# Patient Record
Sex: Female | Born: 2000 | Race: White | Hispanic: No | Marital: Single | Smoking: Never smoker
Health system: Southern US, Community
[De-identification: ages and names within clinical notes are randomized; demographics above are authoritative.]

## PROBLEM LIST (undated history)

## (undated) DIAGNOSIS — K37 Unspecified appendicitis: Secondary | ICD-10-CM

## (undated) DIAGNOSIS — R32 Unspecified urinary incontinence: Secondary | ICD-10-CM

## (undated) HISTORY — DX: Unspecified appendicitis: K37

## (undated) HISTORY — DX: Unspecified urinary incontinence: R32

---
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
## 2021-05-11 ENCOUNTER — Other Ambulatory Visit: Payer: Self-pay

## 2021-05-11 ENCOUNTER — Encounter: Payer: Self-pay | Admitting: Emergency Medicine

## 2021-05-11 ENCOUNTER — Ambulatory Visit
Admission: EM | Admit: 2021-05-11 | Discharge: 2021-05-11 | Disposition: A | Discharge status: Home / Self Care | Payer: BC Managed Care – PPO | Attending: Emergency Medicine | Admitting: Emergency Medicine

## 2021-05-11 DIAGNOSIS — B349 Viral infection, unspecified: Secondary | ICD-10-CM

## 2021-05-11 NOTE — Discharge Instructions (Addendum)
Your COVID and Flu tests are pending.  You should self quarantine until the test results are back.    Take Tylenol or ibuprofen as needed for fever or discomfort.  Rest and keep yourself hydrated.    Follow-up with your primary care provider if your symptoms are not improving.     

## 2021-05-11 NOTE — ED Provider Notes (Signed)
Renaldo Fiddler    CSN: [id number] Arrival date & time: 05/11/21  1057      History   Chief Complaint Chief Complaint  Patient presents with   Sore Throat   Nasal Congestion    HPI Wendy Banks is a 20 y.o. female.  Patient presents with congestion, runny nose, sore throat, cough x4 days.  She thinks she may have had a low-grade fever on the first day of her symptoms but none since.  Treatment at home with cold and flu medication.  She denies rash, shortness of breath, vomiting, diarrhea, or other symptoms.  Her medical history includes tonsillectomy.  The history is provided by the patient and medical records.   Past Medical History:  Diagnosis Date   Appendicitis    Urinary incontinence     Patient Active Problem List   Diagnosis Date Noted   Incisional hernia without obstruction or gangrene 04/29/2016   Urinary incontinence in female 07/02/2015    Past Surgical History:  Procedure Laterality Date   LAPAROSCOPIC APPENDECTOMY N/A 05/09/2015   Procedure: APPENDECTOMY LAPAROSCOPIC;  Surgeon: Lattie Haw, MD;  Location: [facility] ORS;  Service: General;  Laterality: N/A;   NOSE SURGERY  2015   Dr. Andee Poles   TONSILLECTOMY  2008   Dr. Elease Etienne    OB History     Gravida  0   Para  0   Term  0   Preterm  0   AB  0   Living  0      SAB  0   IAB  0   Ectopic  0   Multiple  0   Live Births               Home Medications    Prior to Admission medications   Medication Sig Start Date End Date Taking? Authorizing Provider  Methen-Hyosc-Meth Blue-Na Phos (UROGESIC-BLUE) 81.6 MG TABS Take 1 tablet (81.6 mg total) by mouth 4 (four) times daily as needed. Patient not taking: Reported on 09/04/2019 01/09/19   Shambley, Melody N, CNM  nitrofurantoin, macrocrystal-monohydrate, (MACROBID) 100 MG capsule Take 100 mg by mouth 2 (two) times daily.    [provider]  nitrofurantoin, macrocrystal-monohydrate, (MACROBID) 100 MG capsule Take 1  capsule (100 mg total) by mouth 2 (two) times daily. Patient not taking: Reported on 09/04/2019 01/09/19   Purcell Nails, CNM    Family History Family History  Problem Relation Age of Onset   Heart disease Maternal Grandfather    Diabetes Maternal Grandfather    Diabetes Paternal Grandfather     Social History Social History   Tobacco Use   Smoking status: Never   Smokeless tobacco: Never  Vaping Use   Vaping Use: Never used  Substance Use Topics   Alcohol use: No   Drug use: No     Allergies   Cefdinir   Review of Systems Review of Systems  Constitutional:  Negative for chills and fever.  HENT:  Positive for congestion, rhinorrhea and sore throat. Negative for ear pain.   Respiratory:  Positive for cough. Negative for shortness of breath.   Gastrointestinal:  Negative for diarrhea and vomiting.  Skin:  Negative for color change and rash.  All other systems reviewed and are negative.   Physical Exam Triage Vital Signs ED Triage Vitals  Enc Vitals Group     BP      Pulse      Resp      Temp  Temp src      SpO2      Weight      Height      Head Circumference      Peak Flow      Pain Score      Pain Loc      Pain Edu?      Excl. in [place]?    No data found.  Updated Vital Signs BP 134/72 (BP Location: Left Arm)   Pulse 80   Temp 98.1 F (36.7 C) (Oral)   Resp 18   LMP 04/02/2021   SpO2 98%   Visual Acuity Right Eye Distance:   Left Eye Distance:   Bilateral Distance:    Right Eye Near:   Left Eye Near:    Bilateral Near:     Physical Exam Vitals and nursing note reviewed.  Constitutional:      General: She is not in acute distress.    Appearance: She is well-developed.  HENT:     Right Ear: Tympanic membrane normal.     Left Ear: Tympanic membrane normal.     Nose: Congestion and rhinorrhea present.     Mouth/Throat:     Mouth: Mucous membranes are moist.     Pharynx: Oropharynx is clear.  Cardiovascular:     Rate and Rhythm:  Normal rate and regular rhythm.     Heart sounds: Normal heart sounds.  Pulmonary:     Effort: Pulmonary effort is normal. No respiratory distress.     Breath sounds: Normal breath sounds.  Musculoskeletal:     Cervical back: Neck supple.  Skin:    General: Skin is warm and dry.  Neurological:     Mental Status: She is alert.  Psychiatric:        Mood and Affect: Mood normal.        Behavior: Behavior normal.     UC Treatments / Results  Labs (all labs ordered are listed, but only abnormal results are displayed) Labs Reviewed  COVID-19, FLU A+B NAA    EKG   Radiology No results found.  Procedures Procedures (including critical care time)  Medications Ordered in UC Medications - No data to display  Initial Impression / Assessment and Plan / UC Course  I have reviewed the triage vital signs and the nursing notes.  Pertinent labs & imaging results that were available during my care of the patient were reviewed by me and considered in my medical decision making (see chart for details).   Viral illness.  COVID and Flu pending.  Instructed patient to self quarantine per CDC guidelines.  Discussed symptomatic treatment including Tylenol or ibuprofen, rest, hydration.  Instructed patient to follow up with PCP if symptoms are not improving.  Patient agrees to plan of care.    Final Clinical Impressions(s) / UC Diagnoses   Final diagnoses:  Viral illness     Discharge Instructions      Your COVID and Flu tests are pending.  You should self quarantine until the test results are back.    Take Tylenol or ibuprofen as needed for fever or discomfort.  Rest and keep yourself hydrated.    Follow-up with your primary care provider if your symptoms are not improving.         ED Prescriptions   None    PDMP not reviewed this encounter.   Sharion Balloon, NP 05/11/21 [phone]

## 2021-05-11 NOTE — ED Triage Notes (Signed)
Pt c/o ST and runny nose x 4 days

## 2021-05-13 LAB — COVID-19, FLU A+B NAA
Influenza A, NAA: NOT DETECTED
Influenza B, NAA: NOT DETECTED
SARS-CoV-2, NAA: DETECTED — AB

## 2021-12-14 ENCOUNTER — Encounter: Payer: BC Managed Care – PPO | Admitting: Obstetrics

## 2022-01-11 ENCOUNTER — Encounter: Payer: Self-pay | Admitting: Obstetrics

## 2022-01-11 ENCOUNTER — Other Ambulatory Visit (HOSPITAL_COMMUNITY)
Admission: RE | Admit: 2022-01-11 | Discharge: 2022-01-11 | Disposition: A | Discharge status: Home / Self Care | Payer: BC Managed Care – PPO | Source: Ambulatory Visit | Attending: Obstetrics | Admitting: Obstetrics

## 2022-01-11 ENCOUNTER — Ambulatory Visit: Payer: BC Managed Care – PPO | Admitting: Obstetrics

## 2022-01-11 VITALS — BP 128/82 | HR 69 | Ht 67.0 in | Wt 141.8 lb

## 2022-01-11 DIAGNOSIS — N760 Acute vaginitis: Secondary | ICD-10-CM | POA: Diagnosis not present

## 2022-01-11 DIAGNOSIS — N920 Excessive and frequent menstruation with regular cycle: Secondary | ICD-10-CM

## 2022-01-11 NOTE — Progress Notes (Signed)
GYN ENCOUNTER  Subjective  HPI: Wendy Banks is a 21 y.o. G0P0000 who presents today for evaluation of heavy menstrual bleeding. She reports that she started OCPs, which has improved her cramping. However, her bleeding has not improved, and she passed a large, irregular, semi-solid and gelatinous clot that has the appearance of endometrial tissue or products of conception. Wendy Banks states strongly that she has never been sexually active and denies possible pregnancy or STI.  Past Medical History:  Diagnosis Date   Appendicitis    Urinary incontinence    Past Surgical History:  Procedure Laterality Date   LAPAROSCOPIC APPENDECTOMY N/A 05/09/2015   Procedure: APPENDECTOMY LAPAROSCOPIC;  Surgeon: Lattie Haw, MD;  Location: [facility] ORS;  Service: General;  Laterality: N/A;   NOSE SURGERY  2015   Dr. Andee Poles   TONSILLECTOMY  2008   Dr. Elease Etienne   OB History     Gravida  0   Para  0   Term  0   Preterm  0   AB  0   Living  0      SAB  0   IAB  0   Ectopic  0   Multiple  0   Live Births             Allergies  Allergen Reactions   Cefdinir Itching and Rash     Negative except as noted in HPI History obtained from the patient  Objective  BP 128/82   Pulse 69   Ht 5\' 7"  (1.702 m)   Wt 141 lb 12.8 oz (64.3 kg)   LMP 12/29/2021 (Exact Date)   BMI 22.21 kg/m    General appearance: alert, cooperative, pale  Assessment 1) Abnormal menstrual bleeding with passage of atypical clot  Plan 1) Continue OCPs. Pelvic 02/28/2022 ordered. Swab collected. Declines STI testing. Refer to MD for further evaluation. 2) Recommend iron supplementation due to significant chronic blood loss  [place], CNM

## 2022-01-13 LAB — CERVICOVAGINAL ANCILLARY ONLY
Bacterial Vaginitis (gardnerella): POSITIVE — AB
Candida Glabrata: NEGATIVE
Candida Vaginitis: NEGATIVE
Chlamydia: NEGATIVE
Comment: NEGATIVE
Comment: NEGATIVE
Comment: NEGATIVE
Comment: NEGATIVE
Comment: NEGATIVE
Comment: NORMAL
Neisseria Gonorrhea: NEGATIVE
Trichomonas: NEGATIVE

## 2022-01-14 ENCOUNTER — Other Ambulatory Visit: Payer: Self-pay | Admitting: Obstetrics

## 2022-01-14 ENCOUNTER — Encounter: Payer: Self-pay | Admitting: Obstetrics

## 2022-01-14 MED ORDER — METRONIDAZOLE 500 MG PO TABS: 500.0000 mg | ORAL_TABLET | Freq: Two times a day (BID) | ORAL | 0 refills | Status: DC

## 2022-01-18 ENCOUNTER — Ambulatory Visit (INDEPENDENT_AMBULATORY_CARE_PROVIDER_SITE_OTHER): Payer: BC Managed Care – PPO

## 2022-01-18 DIAGNOSIS — N920 Excessive and frequent menstruation with regular cycle: Secondary | ICD-10-CM

## 2022-01-28 ENCOUNTER — Encounter: Payer: Self-pay | Admitting: Obstetrics and Gynecology

## 2022-01-28 ENCOUNTER — Ambulatory Visit: Payer: BC Managed Care – PPO | Admitting: Obstetrics and Gynecology

## 2022-01-28 VITALS — BP 124/80 | HR 91 | Resp 16 | Ht 67.0 in | Wt 138.9 lb

## 2022-01-28 DIAGNOSIS — N920 Excessive and frequent menstruation with regular cycle: Secondary | ICD-10-CM

## 2022-01-28 DIAGNOSIS — N946 Dysmenorrhea, unspecified: Secondary | ICD-10-CM | POA: Diagnosis not present

## 2022-01-28 MED ORDER — SYEDA 3-0.03 MG PO TABS: 1.0000 | ORAL_TABLET | Freq: Every day | ORAL | 3 refills | Status: DC

## 2022-01-28 NOTE — Progress Notes (Signed)
    GYNECOLOGY PROGRESS NOTE  Subjective:    Patient ID: Wendy Banks, female    DOB: 06-20-2000, 21 y.o.   MRN: [id number]  HPI  Patient is a 21 y.o. G5P0000 female who presents for follow up after pelvic ultrasound. Referred by Guadlupe Spanish, CNM. She had an ultrasound performed new onset menorrhagia and dysmenorrhea on 01/18/2022. Had nausea/vomiting for her last 2 cycles as well as heavy cycles with passage of clots and tissue. Cycles typically last full 7 days. Was initiated on OCPs in mid-July.      The following portions of the patient's history were reviewed and updated as appropriate: allergies, current medications, past family history, past medical history, past social history, past surgical history, and problem list.  Review of Systems Pertinent items are noted in HPI.   Objective:  Blood pressure 124/80, pulse 91, resp. rate 16, height 5\' 7"  (1.702 m), weight 138 lb 14.4 oz (63 kg), last menstrual period 12/29/2021.  General appearance: alert, cooperative, and no distress Abdomen: soft, non-tender; bowel sounds normal; no masses,  no organomegaly Remainder of exam deferred   Imaging:  02/28/2022 PELVIC COMPLETE WITH TRANSVAGINAL Patient Name: Wendy Banks DOB: 12-26-2000 MRN: [id number] LMP: 01/06/2022  ULTRASOUND REPORT [facility] Date of Service: 01/18/2022   Indications: menorrhagia, dysmenorrhea  Transabdominal only (patient is virginal)  Findings:  The uterus is anteverted and measures 7.3 x 4.0 x 5.2 cm. Echo texture is homogenous without evidence of focal masses.  The Endometrium measures 3.0 mm.  Right Ovary measures 3.1 x 2.5 x 3.2 cm. It is normal in appearance. Left Ovary measures 2.7 x 2.3 x 3.1 cm. It is normal in appearance. Survey of the adnexa demonstrates no adnexal masses. There is no free fluid in the cul de sac.  Impression: 1. Normal pelvic ultrasound  Recommendations: 1.Clinical correlation with the patient's History and  Physical Exam.  01/20/2022, RDMS, RVT  The ultrasound images and findings were reviewed by me and I agree with  the above report.  Willette Alma, M.D. 01/25/2022 10:49 AM   Assessment:   1. Menorrhagia with regular cycle   2. Dysmenorrhea      Plan:   Unclear cause of new onset menorrhagia. Ultrasound overall appears normal, no masses or other significant findings. On further discussion of patient's dysmenorrhea, she also notes nausea/vomiting, and pelvic pain that radiates down her legs each month. Discussed differential diagnosis, including endometriosis.  Recently initiated on combined OCPs. Discussed that she should continue medication for at least 3 months to better manage her cycles. Can utilize in cyclic or continuous fashion, discussed risks and benefits of each. Patient ok to use continuously.  Given new prescription for OCPs.  Can use NSAIDs, Tylenol for dysmenorrhea as needed.    01/27/2022, MD [facility]

## 2022-02-22 ENCOUNTER — Telehealth: Payer: Self-pay | Admitting: Obstetrics and Gynecology

## 2022-02-22 MED ORDER — SYEDA 3-0.03 MG PO TABS: 1.0000 | ORAL_TABLET | Freq: Every day | ORAL | 0 refills | Status: DC

## 2022-02-22 NOTE — Telephone Encounter (Signed)
Pt went to refill her script and its saying she needs to call the office she has anappt in Nov can we call her in a script until then

## 2022-02-22 NOTE — Addendum Note (Signed)
Addended by: Chilton Greathouse on: 02/22/2022 03:36 PM   Modules accepted: Orders

## 2022-04-29 ENCOUNTER — Encounter: Payer: BC Managed Care – PPO | Admitting: Obstetrics and Gynecology

## 2022-05-11 NOTE — Progress Notes (Unsigned)
    GYNECOLOGY PROGRESS NOTE  Subjective:    Patient ID: Wendy Banks, female    DOB: July 04, 2000, 21 y.o.   MRN: [id number]  HPI  Patient is a 21 y.o. G0P0000 female who presents for 3 month follow up on abnormal menses.  {Common ambulatory SmartLinks:19316}  Review of Systems {ros; complete:30496}   Objective:   There were no vitals taken for this visit. There is no height or weight on file to calculate BMI. General appearance: {general exam:16600} Abdomen: {abdominal exam:16834} Pelvic: {pelvic exam:16852::"cervix normal in appearance","external genitalia normal","no adnexal masses or tenderness","no cervical motion tenderness","rectovaginal septum normal","uterus normal size, shape, and consistency","vagina normal without discharge"} Extremities: {extremity exam:5109} Neurologic: {neuro exam:17854}   Assessment:   No diagnosis found.   Plan:   There are no diagnoses linked to this encounter.     Hildred Laser, MD [facility]

## 2022-05-12 ENCOUNTER — Ambulatory Visit: Payer: BC Managed Care – PPO | Admitting: Obstetrics and Gynecology

## 2022-05-12 ENCOUNTER — Encounter: Payer: Self-pay | Admitting: Obstetrics and Gynecology

## 2022-05-12 VITALS — BP 121/75 | HR 68 | Resp 16 | Ht 67.0 in | Wt 145.6 lb

## 2022-05-12 DIAGNOSIS — N946 Dysmenorrhea, unspecified: Secondary | ICD-10-CM | POA: Diagnosis not present

## 2022-05-12 DIAGNOSIS — N920 Excessive and frequent menstruation with regular cycle: Secondary | ICD-10-CM

## 2022-05-12 DIAGNOSIS — M5431 Sciatica, right side: Secondary | ICD-10-CM | POA: Diagnosis not present

## 2022-05-12 DIAGNOSIS — M5432 Sciatica, left side: Secondary | ICD-10-CM | POA: Diagnosis not present

## 2022-05-12 MED ORDER — SYEDA 3-0.03 MG PO TABS: 1.0000 | ORAL_TABLET | Freq: Every day | ORAL | 3 refills | Status: DC

## 2023-01-01 ENCOUNTER — Encounter: Payer: Self-pay | Admitting: Obstetrics and Gynecology

## 2023-01-03 ENCOUNTER — Other Ambulatory Visit: Payer: Self-pay

## 2023-01-03 MED ORDER — SYEDA 3-0.03 MG PO TABS: 1.0000 | ORAL_TABLET | Freq: Every day | ORAL | 3 refills | Status: DC

## 2023-01-03 NOTE — Telephone Encounter (Signed)
Yes, a 1 month prescription can be sent to the pharmacy requested.

## 2023-01-30 ENCOUNTER — Other Ambulatory Visit: Payer: Self-pay | Admitting: Obstetrics and Gynecology

## 2023-03-15 ENCOUNTER — Other Ambulatory Visit: Payer: Self-pay

## 2023-03-15 ENCOUNTER — Emergency Department: Payer: 59

## 2023-03-15 ENCOUNTER — Emergency Department
Admission: EM | Admit: 2023-03-15 | Discharge: 2023-03-15 | Disposition: A | Discharge status: Home / Self Care | Payer: 59 | Attending: Emergency Medicine | Admitting: Emergency Medicine

## 2023-03-15 DIAGNOSIS — S0990XA Unspecified injury of head, initial encounter: Secondary | ICD-10-CM | POA: Diagnosis not present

## 2023-03-15 DIAGNOSIS — W228XXA Striking against or struck by other objects, initial encounter: Secondary | ICD-10-CM | POA: Insufficient documentation

## 2023-03-15 DIAGNOSIS — S060X0A Concussion without loss of consciousness, initial encounter: Secondary | ICD-10-CM | POA: Diagnosis not present

## 2023-03-15 MED ORDER — ONDANSETRON 4 MG PO TBDP
4.0000 mg | ORAL_TABLET | Freq: Once | ORAL | Status: AC
  Administered 2023-03-15: 4 mg via ORAL
  Filled 2023-03-15: qty 1

## 2023-03-15 NOTE — ED Provider Notes (Signed)
CT Head Wo Contrast  Result Date: 03/15/2023 CLINICAL DATA:  Hit head 5 days ago, dizziness, headache EXAM: CT HEAD WITHOUT CONTRAST TECHNIQUE: Contiguous axial images were obtained from the base of the skull through the vertex without intravenous contrast. RADIATION DOSE REDUCTION: This exam was performed according to the departmental dose-optimization program which includes automated exposure control, adjustment of the mA and/or kV according to patient size and/or use of iterative reconstruction technique. COMPARISON:  None Available. FINDINGS: Brain: There is no acute intracranial hemorrhage, extra-axial fluid collection, or acute infarct. Parenchymal volume is normal. The ventricles are normal in size. Gray-white differentiation is preserved The pituitary and suprasellar region are normal. There is no mass lesion there is no mass effect or midline shift. Vascular: No hyperdense vessel or unexpected calcification. Skull: Normal. Negative for fracture or focal lesion. Sinuses/Orbits: The imaged paranasal sinuses are clear. The globes and orbits are unremarkable. Other: The mastoid air cells and middle ear cavities are clear. IMPRESSION: Normal head CT. Electronically Signed   By: Lesia Hausen M.D.   On: 03/15/2023 19:13      Sharyn Creamer, MD 03/18/23 [phone]

## 2023-03-15 NOTE — ED Notes (Signed)
See triage note   Presents with head ache  States she hit her head several days ago  No LOC but has been dizzy and slightly off balance

## 2023-03-15 NOTE — ED Notes (Signed)
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 Pt also told KC has unsteady gait. Pt noted to have steady gate by [facility] nurse.

## 2023-03-15 NOTE — ED Provider Notes (Signed)
[facility] Provider Note    Event Date/Time   First MD Initiated Contact with Patient 03/15/23 1702     (approximate)   History   Headache   HPI  Wendy Banks is a 22 y.o. 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  Did not lose consciousness but briefly felt like she "saw stars".  She has had persistent area of tenderness as well as a mild headache over the left frontal scalp where she hit her head.  She did not lose consciousness.  She also reports a very mild nausea.  No light sensitivity no neck injury no other symptoms except she feels just a little bit off balance at times  Denies pregnancy     Physical Exam   Triage Vital Signs: ED Triage Vitals  Encounter Vitals Group     BP 03/15/23 1651 119/73     Systolic BP Percentile --      Diastolic BP Percentile --      Pulse Rate 03/15/23 1651 80     Resp 03/15/23 1651 16     Temp 03/15/23 1651 98.6 F (37 C)     Temp Source 03/15/23 1651 Oral     SpO2 03/15/23 1651 96 %     Weight 03/15/23 1652 142 lb (64.4 kg)     Height 03/15/23 1652 5\' 8"  (1.727 m)     Head Circumference --      Peak Flow --      Pain Score 03/15/23 1648 7     Pain Loc --      Pain Education --      Exclude from Growth Chart --     Most recent vital signs: Vitals:   03/15/23 1651  BP: 119/73  Pulse: 80  Resp: 16  Temp: 98.6 F (37 C)  SpO2: 96%     General: Awake, no distress.  Normocephalic atraumatic, reports mild tenderness along the left frontal scalp though no hematoma or contusion is present.  I localizes that is the area where she gets a mild headache as well CV:  Good peripheral perfusion.  Resp:  Normal effort.  Abd:  No distention.  Other:  Walks with normal gait.  No ataxia noted.  Walks heel-to-toe line without difficulty.  Speech is clear moves all extremities  well with 5 out of 5 strength.  No sensory deficits.  Normal cranial nerve exam   ED Results / Procedures / Treatments   Labs (all labs ordered are listed, but only abnormal results are displayed) Labs Reviewed - No data to display   RADIOLOGY  CT imaging interpreted by me as grossly negative for acute finding or intracranial hemorrhage.   PROCEDURES:  Critical Care performed: No  Procedures   MEDICATIONS ORDERED IN ED: Medications  ondansetron (ZOFRAN-ODT) disintegrating tablet 4 mg (4 mg Oral Given 03/15/23 1714)     IMPRESSION / MDM / ASSESSMENT AND PLAN / ED COURSE  I reviewed the triage vital signs and the nursing notes.                              Differential diagnosis includes, but is not limited to, most likely very mild concussion, also wish to exclude intracranial hemorrhage though this seems highly unlikely in a emergent surgical sense given  her 5 days of symptoms, but discussed with the patient and will proceed with CT of the head without contrast.  Also suspect mild contusion left scalp.  She is awake alert well-oriented hemodynamically stable.    Patient's presentation is most consistent with acute complicated illness / injury requiring diagnostic workup.   Upon review of CT scan which is reassuring in context of patient's condition, suspect mild concussion.  Return precautions and treatment recommendations and follow-up discussed with the patient who is agreeable with the plan.        FINAL CLINICAL IMPRESSION(S) / ED DIAGNOSES   Final diagnoses:  Concussion without loss of consciousness, initial encounter     Rx / DC Orders   ED Discharge Orders     None        Note:  This document was prepared using Dragon voice recognition software and may include unintentional dictation errors.   Sharyn Creamer, MD 03/15/23 [phone]

## 2023-03-15 NOTE — ED Triage Notes (Signed)
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 Steady gait noted. Denies blurry vision. MD at [facility] recommended head CT. Speech clear.

## 2023-05-08 ENCOUNTER — Emergency Department
Admission: EM | Admit: 2023-05-08 | Discharge: 2023-05-08 | Disposition: A | Discharge status: Home / Self Care | Payer: 59 | Attending: Emergency Medicine | Admitting: Emergency Medicine

## 2023-05-08 ENCOUNTER — Emergency Department: Payer: 59

## 2023-05-08 ENCOUNTER — Other Ambulatory Visit: Payer: Self-pay

## 2023-05-08 DIAGNOSIS — R1011 Right upper quadrant pain: Secondary | ICD-10-CM | POA: Insufficient documentation

## 2023-05-08 DIAGNOSIS — R110 Nausea: Secondary | ICD-10-CM | POA: Diagnosis not present

## 2023-05-08 DIAGNOSIS — R079 Chest pain, unspecified: Secondary | ICD-10-CM | POA: Diagnosis not present

## 2023-05-08 DIAGNOSIS — R0789 Other chest pain: Secondary | ICD-10-CM | POA: Diagnosis not present

## 2023-05-08 DIAGNOSIS — R197 Diarrhea, unspecified: Secondary | ICD-10-CM | POA: Insufficient documentation

## 2023-05-08 LAB — CBC
HCT: 39.1 % (ref 36.0–46.0)
Hemoglobin: 13.3 g/dL (ref 12.0–15.0)
MCH: 29 pg (ref 26.0–34.0)
MCHC: 34 g/dL (ref 30.0–36.0)
MCV: 85.4 fL (ref 80.0–100.0)
Platelets: 305 10*3/uL (ref 150–400)
RBC: 4.58 MIL/uL (ref 3.87–5.11)
RDW: 12.2 % (ref 11.5–15.5)
WBC: 6.8 10*3/uL (ref 4.0–10.5)
nRBC: 0 % (ref 0.0–0.2)

## 2023-05-08 LAB — TROPONIN I (HIGH SENSITIVITY): Troponin I (High Sensitivity): <2 ng/L (ref <18)

## 2023-05-08 LAB — BASIC METABOLIC PANEL
Anion gap: 9 (ref 5–15)
BUN: 9 mg/dL (ref 6–20)
CO2: 23 mmol/L (ref 22–32)
Calcium: 8.8 mg/dL — ABNORMAL LOW (ref 8.9–10.3)
Chloride: 103 mmol/L (ref 98–111)
Creatinine, Ser: 0.61 mg/dL (ref 0.44–1.00)
GFR, Estimated: >60 mL/min (ref >60)
Glucose, Bld: 95 mg/dL (ref 70–99)
Potassium: 3.3 mmol/L — ABNORMAL LOW (ref 3.5–5.1)
Sodium: 135 mmol/L (ref 135–145)

## 2023-05-08 LAB — HEPATIC FUNCTION PANEL
ALT: 22 U/L (ref 0–44)
AST: 25 U/L (ref 15–41)
Albumin: 3.7 g/dL (ref 3.5–5.0)
Alkaline Phosphatase: 58 U/L (ref 38–126)
Bilirubin, Direct: <0.1 mg/dL (ref 0.0–0.2)
Total Bilirubin: 0.5 mg/dL (ref <1.2)
Total Protein: 7.3 g/dL (ref 6.5–8.1)

## 2023-05-08 LAB — LIPASE, BLOOD: Lipase: 34 U/L (ref 11–51)

## 2023-05-08 MED ORDER — ONDANSETRON 4 MG PO TBDP
4.0000 mg | ORAL_TABLET | Freq: Once | ORAL | Status: AC
  Administered 2023-05-08: 4 mg via ORAL
  Filled 2023-05-08: qty 1

## 2023-05-08 MED ORDER — ALUM & MAG HYDROXIDE-SIMETH 200-200-20 MG/5ML PO SUSP
30.0000 mL | Freq: Once | ORAL | Status: AC
  Administered 2023-05-08: 30 mL via ORAL
  Filled 2023-05-08: qty 30

## 2023-05-08 MED ORDER — DICYCLOMINE HCL 10 MG/5ML PO SOLN
10.0000 mg | Freq: Once | ORAL | Status: AC
  Administered 2023-05-08: 10 mg via ORAL
  Filled 2023-05-08 (×2): qty 5

## 2023-05-08 MED ORDER — ONDANSETRON 4 MG PO TBDP: 4.0000 mg | ORAL_TABLET | Freq: Three times a day (TID) | ORAL | 1 refills | Status: AC | PRN

## 2023-05-08 NOTE — Discharge Instructions (Addendum)
You were evaluated in the ED for abdominal pain.  Your blood work is reassuring.  Your lipase is normal.  Your troponin and hepatic panel is normal.  Your chest x-ray is normal.  Your ultrasound is normal.  Your potassium is mildly low at 3.3.  You can include potassium in your diet by eating dark leafy greens and fruits such as bananas and oranges.  If symptoms persist please follow-up with your primary care.  Ensure to monitor your symptoms.

## 2023-05-08 NOTE — ED Provider Triage Note (Signed)
Emergency Medicine Provider Triage Evaluation Note  Wendy Banks , a 22 y.o. female  was evaluated in triage.  Pt complains of RUQ pain x 2 days with nausea. Worse after eating food. Denies fever but has taking a lot of ibuprofen. Denies recent abdominal surgeries. No appendix  Review of Systems  Positive:  Negative: SOB and chest pain    Physical Exam  BP 137/79 (BP Location: Right Arm)   Pulse 80   Temp 98.7 F (37.1 C) (Oral)   Resp 15   Wt 64.4 kg   BMI 21.59 kg/m  Gen:   Awake, no distress   Resp:  Normal effort  MSK:   Moves extremities without difficulty  Other:  RUQ tenderness. (+) Murphy  Medical Decision Making  Medically screening exam initiated at 7:06 PM.  Appropriate orders placed.  Wendy Banks was informed that the remainder of the evaluation will be completed by another provider, this initial triage assessment does not replace that evaluation, and the importance of remaining in the ED until their evaluation is complete.  [place] RUQ and Liver panel ordered.    Wendy Banks A, PA-C 05/08/23 1908

## 2023-05-08 NOTE — ED Provider Notes (Signed)
[facility] Emergency Department Provider Note     Event Date/Time   First MD Initiated Contact with Patient 05/08/23 1931     (approximate)   History   No chief complaint on file.   HPI  Wendy Banks is a 22 y.o. female with no significant past medical history presents to the ED with complaint of right upper quadrant pain x 2 days with associated symptoms of nausea but no active vomiting.  She reports it is worse after eating food.  She denies fever but has taken ibuprofen for the pain.  She denies any recent abdominal surgeries.  She endorses recent illness last week of sinus congestion and cough.  Endorses 1 episode of diarrhea.     Physical Exam   Triage Vital Signs: ED Triage Vitals  Encounter Vitals Group     BP 05/08/23 1702 137/79     Systolic BP Percentile --      Diastolic BP Percentile --      Pulse Rate 05/08/23 1702 80     Resp 05/08/23 1702 15     Temp 05/08/23 1702 98.7 F (37.1 C)     Temp Source 05/08/23 1702 Oral     SpO2 --      Weight 05/08/23 1716 141 lb 15.6 oz (64.4 kg)     Height --      Head Circumference --      Peak Flow --      Pain Score 05/08/23 1716 4     Pain Loc --      Pain Education --      Exclude from Growth Chart --     Most recent vital signs: Vitals:   05/08/23 1702  BP: 137/79  Pulse: 80  Resp: 15  Temp: 98.7 F (37.1 C)   General: Well appearing. Alert and oriented. INAD.  Skin:  Warm, dry and intact. No rashes or lesions noted.     Head:  NCAT.  Eyes:  PERRLA. EOMI.  CV:  Good peripheral perfusion. RRR.  RESP:  Normal effort. LCTAB. No retractions.  ABD:  No distention. Soft, tenderness in right upper quadrant.    ED Results / Procedures / Treatments   Labs (all labs ordered are listed, but only abnormal results are displayed) Labs Reviewed  BASIC METABOLIC PANEL - Abnormal; Notable for the following components:      Result Value   Potassium 3.3 (*)    Calcium 8.8 (*)     All other components within normal limits  CBC  LIPASE, BLOOD  HEPATIC FUNCTION PANEL  POC URINE PREG, ED  TROPONIN I (HIGH SENSITIVITY)  TROPONIN I (HIGH SENSITIVITY)    RADIOLOGY  I personally viewed and evaluated these images as part of my medical decision making, as well as reviewing the written report by the radiologist.  ED Provider Interpretation: Ultrasound is unremarkable.  US Abdomen Limited RUQ (LIVER/GB)  Result Date: 05/08/2023 CLINICAL DATA:  Right upper quadrant pain. EXAM: ULTRASOUND ABDOMEN LIMITED RIGHT UPPER QUADRANT COMPARISON:  None Available. FINDINGS: Gallbladder: No gallstones or wall thickening visualized. No sonographic Murphy sign noted by sonographer. Common bile duct: Diameter: 1 mm Liver: No focal lesion identified. Within normal limits in parenchymal echogenicity. Portal vein is patent on color Doppler imaging with normal direction of blood flow towards the liver. Other: None. IMPRESSION: Normal right upper quadrant ultrasound. Electronically Signed   By: Amie Portland M.D.   On: 05/08/2023 19:42   DG Chest 2  View  Result Date: 05/08/2023 CLINICAL DATA:  Right-sided chest pain that radiates to the epigastric region. EXAM: CHEST - 2 VIEW COMPARISON:  04/12/2005 FINDINGS: Heart size and mediastinal contours are normal. No pleural fluid, interstitial edema, or airspace consolidation. The visualized osseous structures are within normal limits. IMPRESSION: No active cardiopulmonary disease. Electronically Signed   By: Signa Kell M.D.   On: 05/08/2023 17:40    PROCEDURES:  Critical Care performed: No  Procedures   MEDICATIONS ORDERED IN ED: Medications  alum & mag hydroxide-simeth (MAALOX/MYLANTA) 200-200-20 MG/5ML suspension 30 mL (has no administration in time range)  dicyclomine (BENTYL) 10 MG/5ML solution 10 mg (has no administration in time range)  ondansetron (ZOFRAN-ODT) disintegrating tablet 4 mg (has no administration in time range)      IMPRESSION / MDM / ASSESSMENT AND PLAN / ED COURSE  I reviewed the triage vital signs and the nursing notes.                               22 y.o. female presents to the emergency department for evaluation and treatment of right upper quadrant pain. See HPI for further details.   Differential diagnosis includes, but is not limited to cholelithiasis, cholecystitis, constipation, viral gastroenteritis  Patient's presentation is most consistent with acute complicated illness / injury requiring diagnostic workup.  Patient is alert and oriented.  She is hemodynamically stable and afebrile.  Exam findings are overall benign and pertinent for right upper quadrant tenderness to palpation.  Lab work is reassuring.  Lipase is normal.  Patient reports she is not having any shortness of breath or chest pain.  However, chest x-ray and troponin is normal.  Ultrasound obtained and is unremarkable.  Liver panel reveals no abnormality of liver enzymes which is reassuring.  Patient declined providing urine pregnancy test as she reports there is no way that she could be pregnant at this time as her menstrual cycle was just on during Thanksgiving.  Given patient's history of recent illness I do believe this is clinically consistent with a viral gastroenteritis.  GI cocktail provided in the ED.  Will prescribe Zofran for outpatient use.  Patient is encouraged to monitor her symptoms and follow-up with her primary care as needed.  She is in stable condition for discharge home.  ED precautions discussed.  FINAL CLINICAL IMPRESSION(S) / ED DIAGNOSES   Final diagnoses:  Right upper quadrant abdominal pain   Rx / DC Orders   ED Discharge Orders          Ordered    ondansetron (ZOFRAN-ODT) 4 MG disintegrating tablet  Every 8 hours PRN        05/08/23 2122            Note:  This document was prepared using Dragon voice recognition software and may include unintentional dictation errors.    Romeo Apple,  Charonda Hefter A, PA-C 05/08/23 2133    Phineas Semen, MD 05/08/23 2250

## 2023-05-08 NOTE — ED Notes (Signed)
Per lab, lipase is 34 but is not showing in MR. MD aware.

## 2023-05-08 NOTE — ED Triage Notes (Signed)
C/O sob and shooting pain  from xyphoid radiating to RUQ and intermittently to back.. Symptoms x 2 days.

## 2023-05-09 ENCOUNTER — Telehealth: Payer: Self-pay | Admitting: Internal Medicine

## 2023-05-09 NOTE — Telephone Encounter (Signed)
Patient's mother in law called to ask if Dr. Para March may be able to take her as a new patient? States that patient was seen in ED for abd pain and needs f/u, says she does not have a pcp.  Mother in law Gae Gallop sees Dr Para March and patient's husband does as well. Okay to make appt to establish care?

## 2023-05-10 ENCOUNTER — Other Ambulatory Visit: Payer: Self-pay | Admitting: Internal Medicine

## 2023-05-10 ENCOUNTER — Ambulatory Visit
Admission: RE | Admit: 2023-05-10 | Discharge: 2023-05-10 | Disposition: A | Discharge status: Home / Self Care | Payer: 59 | Source: Ambulatory Visit | Attending: Internal Medicine | Admitting: Internal Medicine

## 2023-05-10 DIAGNOSIS — R1031 Right lower quadrant pain: Secondary | ICD-10-CM | POA: Insufficient documentation

## 2023-05-10 DIAGNOSIS — R100 Acute abdomen: Secondary | ICD-10-CM | POA: Insufficient documentation

## 2023-05-10 DIAGNOSIS — Z3202 Encounter for pregnancy test, result negative: Secondary | ICD-10-CM | POA: Diagnosis not present

## 2023-05-10 MED ORDER — IOHEXOL 300 MG/ML  SOLN
100.0000 mL | Freq: Once | INTRAMUSCULAR | Status: AC | PRN
  Administered 2023-05-10: 100 mL via INTRAVENOUS

## 2023-05-10 NOTE — Telephone Encounter (Signed)
Please schedule patient to be seen in office. Thank you.

## 2023-05-10 NOTE — Telephone Encounter (Signed)
Spoke with patient and stated that she was able to get in with her provider in [place]. She did want to say thank you to Dr. Para March.

## 2023-05-10 NOTE — Telephone Encounter (Signed)
Please set up when possible.  Thanks.

## 2023-05-11 ENCOUNTER — Other Ambulatory Visit: Payer: Self-pay | Admitting: Internal Medicine

## 2023-05-11 DIAGNOSIS — R1031 Right lower quadrant pain: Secondary | ICD-10-CM

## 2023-05-11 DIAGNOSIS — R100 Acute abdomen: Secondary | ICD-10-CM

## 2023-05-16 DIAGNOSIS — R1031 Right lower quadrant pain: Secondary | ICD-10-CM | POA: Diagnosis not present

## 2023-06-02 ENCOUNTER — Encounter: Payer: Self-pay | Admitting: Obstetrics and Gynecology

## 2023-06-03 ENCOUNTER — Other Ambulatory Visit: Payer: Self-pay | Admitting: Obstetrics and Gynecology

## 2023-06-03 ENCOUNTER — Other Ambulatory Visit: Payer: Self-pay

## 2023-06-03 DIAGNOSIS — N920 Excessive and frequent menstruation with regular cycle: Secondary | ICD-10-CM

## 2023-06-03 MED ORDER — SYEDA 3-0.03 MG PO TABS: 1.0000 | ORAL_TABLET | Freq: Every day | ORAL | 0 refills | Status: DC

## 2023-07-01 NOTE — Patient Instructions (Signed)
Preventive Care 22-22 Years Old, Female Preventive care refers to lifestyle choices and visits with your health care provider that can promote health and wellness. At this stage in your life, you may start seeing a primary care physician instead of a pediatrician for your preventive care. Preventive care visits are also called wellness exams. What can I expect for my preventive care visit? Counseling During your preventive care visit, your health care provider may ask about your: Medical history, including: Past medical problems. Family medical history. Pregnancy history. Current health, including: Menstrual cycle. Method of birth control. Emotional well-being. Home life and relationship well-being. Sexual activity and sexual health. Lifestyle, including: Alcohol, nicotine or tobacco, and drug use. Access to firearms. Diet, exercise, and sleep habits. Sunscreen use. Motor vehicle safety. Physical exam Your health care provider may check your: Height and weight. These may be used to calculate your BMI (body mass index). BMI is a measurement that tells if you are at a healthy weight. Waist circumference. This measures the distance around your waistline. This measurement also tells if you are at a healthy weight and may help predict your risk of certain diseases, such as type 2 diabetes and high blood pressure. Heart rate and blood pressure. Body temperature. Skin for abnormal spots. Breasts. What immunizations do I need?  Vaccines are usually given at various ages, according to a schedule. Your health care provider will recommend vaccines for you based on your age, medical history, and lifestyle or other factors, such as travel or where you work. What tests do I need? Screening Your health care provider may recommend screening tests for certain conditions. This may include: Vision and hearing tests. Lipid and cholesterol levels. Pelvic exam and Pap test. Hepatitis B  test. Hepatitis C test. HIV (human immunodeficiency virus) test. STI (sexually transmitted infection) testing, if you are at risk. Tuberculosis skin test if you have symptoms. BRCA-related cancer screening. This may be done if you have a family history of breast, ovarian, tubal, or peritoneal cancers. Talk with your health care provider about your test results, treatment options, and if necessary, the need for more tests. Follow these instructions at home: Eating and drinking  Eat a healthy diet that includes fresh fruits and vegetables, whole grains, lean protein, and low-fat dairy products. Drink enough fluid to keep your urine pale yellow. Do not drink alcohol if: Your health care provider tells you not to drink. You are pregnant, may be pregnant, or are planning to become pregnant. You are under the legal drinking age. In the [place] the legal drinking age is 67. If you drink alcohol: Limit how much you have to 0-1 drink a day. Know how much alcohol is in your drink. In the [place] one drink equals one 12 oz bottle of beer (355 mL), one 5 oz glass of wine (148 mL), or one 1 oz glass of hard liquor (44 mL). Lifestyle Brush your teeth every morning and night with fluoride toothpaste. Floss one time each day. Exercise for at least 30 minutes 5 or more days of the week. Do not use any products that contain nicotine or tobacco. These products include cigarettes, chewing tobacco, and vaping devices, such as e-cigarettes. If you need help quitting, ask your health care provider. Do not use drugs. If you are sexually active, practice safe sex. Use a condom or other form of protection to prevent STIs. If you do not wish to become pregnant, use a form of birth control. If you plan to become pregnant,  see your health care provider for a prepregnancy visit. Find healthy ways to manage stress, such as: Meditation, yoga, or listening to music. Journaling. Talking to a trusted person. Spending time  with friends and family. Safety Always wear your seat belt while driving or riding in a vehicle. Do not drive: If you have been drinking alcohol. Do not ride with someone who has been drinking. When you are tired or distracted. While texting. If you have been using any mind-altering substances or drugs. Wear a helmet and other protective equipment during sports activities. If you have firearms in your house, make sure you follow all gun safety procedures. Seek help if you have been bullied, physically abused, or sexually abused. Use the internet responsibly to avoid dangers, such as online bullying and online sex predators. What's next? Go to your health care provider once a year for an annual wellness visit. Ask your health care provider how often you should have your eyes and teeth checked. Stay up to date on all vaccines. This information is not intended to replace advice given to you by your health care provider. Make sure you discuss any questions you have with your health care provider. Document Revised: 11/12/2020 Document Reviewed: 11/12/2020 Elsevier Patient Education  2024 [employer]. Breast Self-Awareness Breast self-awareness is knowing how your breasts look and feel. You need to: Check your breasts on a regular basis. Tell your doctor about any changes. Become familiar with the look and feel of your breasts. This can help you catch a breast problem while it is still small and can be treated. You should do breast self-exams even if you have breast implants. What you need: A mirror. A well-lit room. A pillow or other soft object. How to do a breast self-exam Follow these steps to do a breast self-exam: Look for changes  Take off all the clothes above your waist. Stand in front of a mirror in a room with good lighting. Put your hands down at your sides. Compare your breasts in the mirror. Look for any difference between them, such as: A difference in shape. A  difference in size. Wrinkles, dips, and bumps in one breast and not the other. Look at each breast for changes in the skin, such as: Redness. Scaly areas. Skin that has gotten thicker. Dimpling. Open sores (ulcers). Look for changes in your nipples, such as: Fluid coming out of a nipple. Fluid around a nipple. Bleeding. Dimpling. Redness. A nipple that looks pushed in (retracted), or that has changed position. Feel for changes Lie on your back. Feel each breast. To do this: Pick a breast to feel. Place a pillow under the shoulder closest to that breast. Put the arm closest to that breast behind your head. Feel the nipple area of that breast using the hand of your other arm. Feel the area with the pads of your three middle fingers by making small circles with your fingers. Use light, medium, and firm pressure. Continue the overlapping circles, moving downward over the breast. Keep making circles with your fingers. Stop when you feel your ribs. Start making circles with your fingers again, this time going upward until you reach your collarbone. Then, make circles outward across your breast and into your armpit area. Squeeze your nipple. Check for discharge and lumps. Repeat these steps to check your other breast. Sit or stand in the tub or shower. With soapy water on your skin, feel each breast the same way you did when you were lying down.  Write down what you find Writing down what you find can help you remember what to tell your doctor. Write down: What is normal for each breast. Any changes you find in each breast. These include: The kind of changes you find. A tender or painful breast. Any lump you find. Write down its size and where it is. When you last had your monthly period (menstrual cycle). General tips If you are breastfeeding, the best time to check your breasts is after you feed your baby or after you use a breast pump. If you get monthly bleeding, the best time to  check your breasts is 5-7 days after your monthly cycle ends. With time, you will become comfortable with the self-exam. You will also start to know if there are changes in your breasts. Contact a doctor if: You see a change in the shape or size of your breasts or nipples. You see a change in the skin of your breast or nipples, such as red or scaly skin. You have fluid coming from your nipples that is not normal. You find a new lump or thick area. You have breast pain. You have any concerns about your breast health. Summary Breast self-awareness includes looking for changes in your breasts and feeling for changes within your breasts. You should do breast self-awareness in front of a mirror in a well-lit room. If you get monthly periods (menstrual cycles), the best time to check your breasts is 5-7 days after your period ends. Tell your doctor about any changes you see in your breasts. Changes include changes in size, changes on the skin, painful or tender breasts, or fluid from your nipples that is not normal. This information is not intended to replace advice given to you by your health care provider. Make sure you discuss any questions you have with your health care provider. Document Revised: 10/22/2021 Document Reviewed: 03/19/2021 Elsevier Patient Education  2024 [employer].

## 2023-07-01 NOTE — Progress Notes (Signed)
 GYNECOLOGY ANNUAL PHYSICAL EXAM PROGRESS NOTE  Subjective:    Wendy Banks is a 23 y.o. G0P0000 female who presents for an annual exam.  The patient is sexually active. The patient participates in regular exercise: yes. Has the patient ever been transfused or tattooed?: yes. The patient reports that there is not domestic violence in her life.   The patient has the following complaints today: Reports issues with chronic constipation. Notes that she has dealt with constipation for most of her life. Has utilized laxatives in the past. Most recent episode was the longest episode for her, where she did not have a BM for 2 weeks. Tried laxatives including magnesium citrated (tried tablets first, then dissolvable powder). Notes that she regularly uses Senna tablets daily, eats fairly healthy and drinks plenty of water, but still has issues. Reports an episode of abdominal pain in the remote past where she was diagnosed with diverticulitis, but reports that she has never undergone evaluation by a GI specialist.   Menstrual History: Menarche age: 37 No LMP recorded. (Menstrual status: Oral contraceptives). (Takes OCP continuously)    Gynecologic History:  Contraception: OCP (estrogen/progesterone) History of STI's: Denies Last Pap: Never Done Last mammogram: Not age appropriate   OB History  Gravida Para Term Preterm AB Living  0 0 0 0 0 0  SAB IAB Ectopic Multiple Live Births  0 0 0 0 0    Past Medical History:  Diagnosis Date   Appendicitis    Urinary incontinence     Past Surgical History:  Procedure Laterality Date   LAPAROSCOPIC APPENDECTOMY N/A 05/09/2015   Procedure: APPENDECTOMY LAPAROSCOPIC;  Surgeon: Charlie FORBES Fell, MD;  Location: [facility] ORS;  Service: General;  Laterality: N/A;   NOSE SURGERY  2015   Dr. Milissa   TONSILLECTOMY  2008   Dr. Nadean    Family History  Problem Relation Age of Onset   Heart disease Maternal Grandfather    Diabetes Maternal  Grandfather    Diabetes Paternal Grandfather     Social History   Socioeconomic History   Marital status: Single    Spouse name: Not on file   Number of children: Not on file   Years of education: Not on file   Highest education level: Not on file  Occupational History   Not on file  Tobacco Use   Smoking status: Never   Smokeless tobacco: Never  Vaping Use   Vaping status: Never Used  Substance and Sexual Activity   Alcohol use: No   Drug use: No   Sexual activity: Never  Other Topics Concern   Not on file  Social History Narrative   Not on file   Social Drivers of Health   Financial Resource Strain: Low Risk  (05/10/2023)   Received from [facility]   Overall Financial Resource Strain (CARDIA)    Difficulty of Paying Living Expenses: Not hard at all  Food Insecurity: No Food Insecurity (05/10/2023)   Received from [facility]   Hunger Vital Sign    Worried About Running Out of Food in the Last Year: Never true    Ran Out of Food in the Last Year: Never true  Transportation Needs: No Transportation Needs (05/10/2023)   Received from [facility] - Transportation    In the past 12 months, has lack of transportation kept you from medical appointments or from getting medications?: No    Lack of Transportation (Non-Medical):  No  Physical Activity: Not on file  Stress: Not on file  Social Connections: Not on file  Intimate Partner Violence: Not on file    Current Outpatient Medications on File Prior to Visit  Medication Sig Dispense Refill   ondansetron  (ZOFRAN -ODT) 4 MG disintegrating tablet Take 1 tablet (4 mg total) by mouth every 8 (eight) hours as needed. 20 tablet 1   SYEDA  3-0.03 MG tablet Take 1 tablet by mouth daily. Take active pills only, skip placebo piills. 30 tablet 0   No current facility-administered medications on file prior to visit.    Allergies  Allergen Reactions   Cefdinir  Itching and Rash     Review of Systems Constitutional: negative for chills, fatigue, fevers and sweats Eyes: negative for irritation, redness and visual disturbance Ears, nose, mouth, throat, and face: negative for hearing loss, nasal congestion, snoring and tinnitus Respiratory: negative for asthma, cough, sputum Cardiovascular: negative for chest pain, dyspnea, exertional chest pressure/discomfort, irregular heart beat, palpitations and syncope Gastrointestinal: negative for abdominal pain, nausea and vomiting. Positive for chronic constipation.  Genitourinary: negative for abnormal menstrual periods, genital lesions, sexual problems and vaginal discharge, dysuria and urinary incontinence Integument/breast: negative for breast lump, breast tenderness and nipple discharge Hematologic/lymphatic: negative for bleeding and easy bruising Musculoskeletal:negative for back pain and muscle weakness Neurological: negative for dizziness, headaches, vertigo and weakness Endocrine: negative for diabetic symptoms including polydipsia, polyuria and skin dryness Allergic/Immunologic: negative for hay fever and urticaria      Objective:  Blood pressure (!) 120/98, pulse 98, resp. rate 16, height 5' 7 (1.702 m), weight 130 lb (59 kg).  Body mass index is 20.36 kg/m.    General Appearance:    Alert, cooperative, no distress, appears stated age  Head:    Normocephalic, without obvious abnormality, atraumatic  Eyes:    PERRL, conjunctiva/corneas clear, EOM's intact, both eyes  Ears:    Normal external ear canals, both ears  Nose:   Nares normal, septum midline, mucosa normal, no drainage or sinus tenderness  Throat:   Lips, mucosa, and tongue normal; teeth and gums normal  Neck:   Supple, symmetrical, trachea midline, no adenopathy; thyroid: no enlargement/tenderness/nodules; no carotid bruit or JVD  Back:     Symmetric, no curvature, ROM normal, no CVA tenderness  Lungs:     Clear to auscultation  bilaterally, respirations unlabored  Chest Wall:    No tenderness or deformity   Heart:    Regular rate and rhythm, S1 and S2 normal, no murmur, rub or gallop  Breast Exam:    No tenderness, masses, or nipple abnormality  Abdomen:     Soft, non-tender, bowel sounds active all four quadrants, no masses, no organomegaly.    Genitalia:    Pelvic:external genitalia normal, vagina without lesions, discharge, or tenderness, rectovaginal septum  normal. Cervix normal in appearance, no cervical motion tenderness, no adnexal masses or tenderness.  Uterus normal size, shape, mobile, regular contours, nontender.  Rectal:    Normal external sphincter.  No hemorrhoids appreciated. Internal exam not done.   Extremities:   Extremities normal, atraumatic, no cyanosis or edema  Pulses:   2+ and symmetric all extremities  Skin:   Skin color, texture, turgor normal, no rashes or lesions  Lymph nodes:   Cervical, supraclavicular, and axillary nodes normal  Neurologic:   CNII-XII intact, normal strength, sensation and reflexes throughout   .  Labs:  Lab Results  Component Value Date   WBC 6.8 05/08/2023   HGB  13.3 05/08/2023   HCT 39.1 05/08/2023   MCV 85.4 05/08/2023   PLT 305 05/08/2023    Lab Results  Component Value Date   CREATININE 0.61 05/08/2023   BUN 9 05/08/2023   NA 135 05/08/2023   K 3.3 (L) 05/08/2023   CL 103 05/08/2023   CO2 23 05/08/2023    Lab Results  Component Value Date   ALT 22 05/08/2023   AST 25 05/08/2023   ALKPHOS 58 05/08/2023   BILITOT 0.5 05/08/2023    No results found for: TSH   Assessment:   1. Encounter for well woman exam with routine gynecological exam   2. Cervical cancer screening   3. Encounter for screening for HIV   4. Need for hepatitis C screening test   5. Menorrhagia with regular cycle   6. Chronic constipation      Plan:  Blood tests: UTD.  Breast self exam technique reviewed and patient encouraged to perform self-exam  monthly. Contraception: OCP (estrogen/progesterone). Continuous method for management of menorrhagia. Discussed healthy lifestyle modifications. Mammogram  : Not age appropriate Pap smear ordered. Included GC/CT screening per age-based screening guidelines.  Flu vaccine: Declined Tdap: declined Needs 1 time low risk screening for Hep C and HIV.  Referral placed to GI for chronic constipation evaluation.  Follow up in 1 year for annual exam   Archie Savers, MD [facility]

## 2023-07-05 ENCOUNTER — Ambulatory Visit (INDEPENDENT_AMBULATORY_CARE_PROVIDER_SITE_OTHER): Payer: 59 | Admitting: Obstetrics and Gynecology

## 2023-07-05 ENCOUNTER — Other Ambulatory Visit (HOSPITAL_COMMUNITY)
Admission: RE | Admit: 2023-07-05 | Discharge: 2023-07-05 | Disposition: A | Discharge status: Home / Self Care | Payer: 59 | Source: Ambulatory Visit | Attending: Obstetrics and Gynecology | Admitting: Obstetrics and Gynecology

## 2023-07-05 ENCOUNTER — Encounter: Payer: Self-pay | Admitting: Obstetrics and Gynecology

## 2023-07-05 VITALS — BP 120/98 | HR 98 | Resp 16 | Ht 67.0 in | Wt 130.0 lb

## 2023-07-05 DIAGNOSIS — Z1159 Encounter for screening for other viral diseases: Secondary | ICD-10-CM

## 2023-07-05 DIAGNOSIS — K5909 Other constipation: Secondary | ICD-10-CM

## 2023-07-05 DIAGNOSIS — Z01419 Encounter for gynecological examination (general) (routine) without abnormal findings: Secondary | ICD-10-CM

## 2023-07-05 DIAGNOSIS — Z124 Encounter for screening for malignant neoplasm of cervix: Secondary | ICD-10-CM | POA: Diagnosis not present

## 2023-07-05 DIAGNOSIS — N920 Excessive and frequent menstruation with regular cycle: Secondary | ICD-10-CM

## 2023-07-05 DIAGNOSIS — Z114 Encounter for screening for human immunodeficiency virus [HIV]: Secondary | ICD-10-CM

## 2023-07-05 MED ORDER — SYEDA 3-0.03 MG PO TABS: 1.0000 | ORAL_TABLET | Freq: Every day | ORAL | 3 refills | Status: DC

## 2023-07-11 ENCOUNTER — Encounter: Payer: Self-pay | Admitting: Obstetrics and Gynecology

## 2023-07-11 LAB — CYTOLOGY - PAP: Diagnosis: NEGATIVE

## 2024-04-12 ENCOUNTER — Telehealth: Payer: Self-pay

## 2024-04-12 NOTE — Telephone Encounter (Signed)
 Patient calling triage requesting BC RF Wendy Banks  3-0.03 MG tablet . Advised Dr. Connell sent one year supply 112 tablets with 3 RF's at her annual on 07/2023. She doesn't think she has been to pharmacy 4 times already. She will check with her pharmacy and follow up with us .

## 2024-04-17 ENCOUNTER — Other Ambulatory Visit: Payer: Self-pay

## 2024-04-17 DIAGNOSIS — N920 Excessive and frequent menstruation with regular cycle: Secondary | ICD-10-CM

## 2024-04-17 MED ORDER — SYEDA 3-0.03 MG PO TABS: 1.0000 | ORAL_TABLET | Freq: Every day | ORAL | 0 refills | Status: DC

## 2024-04-17 NOTE — Telephone Encounter (Signed)
 Pt called triage needing refill on her BC. She takes them continually so she runs out. Refill sent as pt had annual on 07/2023.

## 2024-07-02 ENCOUNTER — Other Ambulatory Visit: Payer: Self-pay | Admitting: Licensed Practical Nurse

## 2024-07-02 DIAGNOSIS — N920 Excessive and frequent menstruation with regular cycle: Secondary | ICD-10-CM

## 2024-07-13 ENCOUNTER — Ambulatory Visit: Admitting: Certified Nurse Midwife

## 2024-08-17 ENCOUNTER — Ambulatory Visit: Admitting: Certified Nurse Midwife
